# Patient Record
Sex: Female | Born: 1950 | Race: White | Hispanic: No | Marital: Married
Health system: Southern US, Community
[De-identification: ages and names within clinical notes are randomized; demographics above are authoritative.]

## PROBLEM LIST (undated history)

## (undated) HISTORY — PX: BREAST BIOPSY: SHX20

---

## 1997-08-25 ENCOUNTER — Inpatient Hospital Stay (HOSPITAL_COMMUNITY): Admission: RE | Admit: 1997-08-25 | Discharge: 1997-08-27 | Payer: Self-pay | Admitting: Obstetrics & Gynecology

## 1999-09-13 ENCOUNTER — Encounter: Payer: Self-pay | Admitting: Orthopedic Surgery

## 1999-09-13 ENCOUNTER — Ambulatory Visit (HOSPITAL_COMMUNITY): Admission: RE | Admit: 1999-09-13 | Discharge: 1999-09-13 | Payer: Self-pay | Admitting: Orthopedic Surgery

## 2000-02-05 ENCOUNTER — Encounter: Payer: Self-pay | Admitting: Family Medicine

## 2000-02-05 ENCOUNTER — Encounter: Admission: RE | Admit: 2000-02-05 | Discharge: 2000-02-05 | Payer: Self-pay | Admitting: Family Medicine

## 2003-05-02 ENCOUNTER — Encounter: Admission: RE | Admit: 2003-05-02 | Discharge: 2003-05-02 | Payer: Self-pay | Admitting: Family Medicine

## 2005-04-09 ENCOUNTER — Ambulatory Visit: Payer: Self-pay | Admitting: Internal Medicine

## 2005-05-14 ENCOUNTER — Ambulatory Visit: Payer: Self-pay | Admitting: Internal Medicine

## 2010-02-12 ENCOUNTER — Emergency Department (HOSPITAL_COMMUNITY): Admission: EM | Admit: 2010-02-12 | Discharge: 2010-02-12 | Payer: Self-pay | Admitting: Emergency Medicine

## 2012-04-15 IMAGING — CR DG WRIST COMPLETE 3+V*L*
4 series · 4 of 4 positions shown · non-contrast
Comparison: None.

CLINICAL DATA: Fall, diffuse wrist pain.

LEFT WRIST - COMPLETE 3+ VIEW

[x wrist pa left]
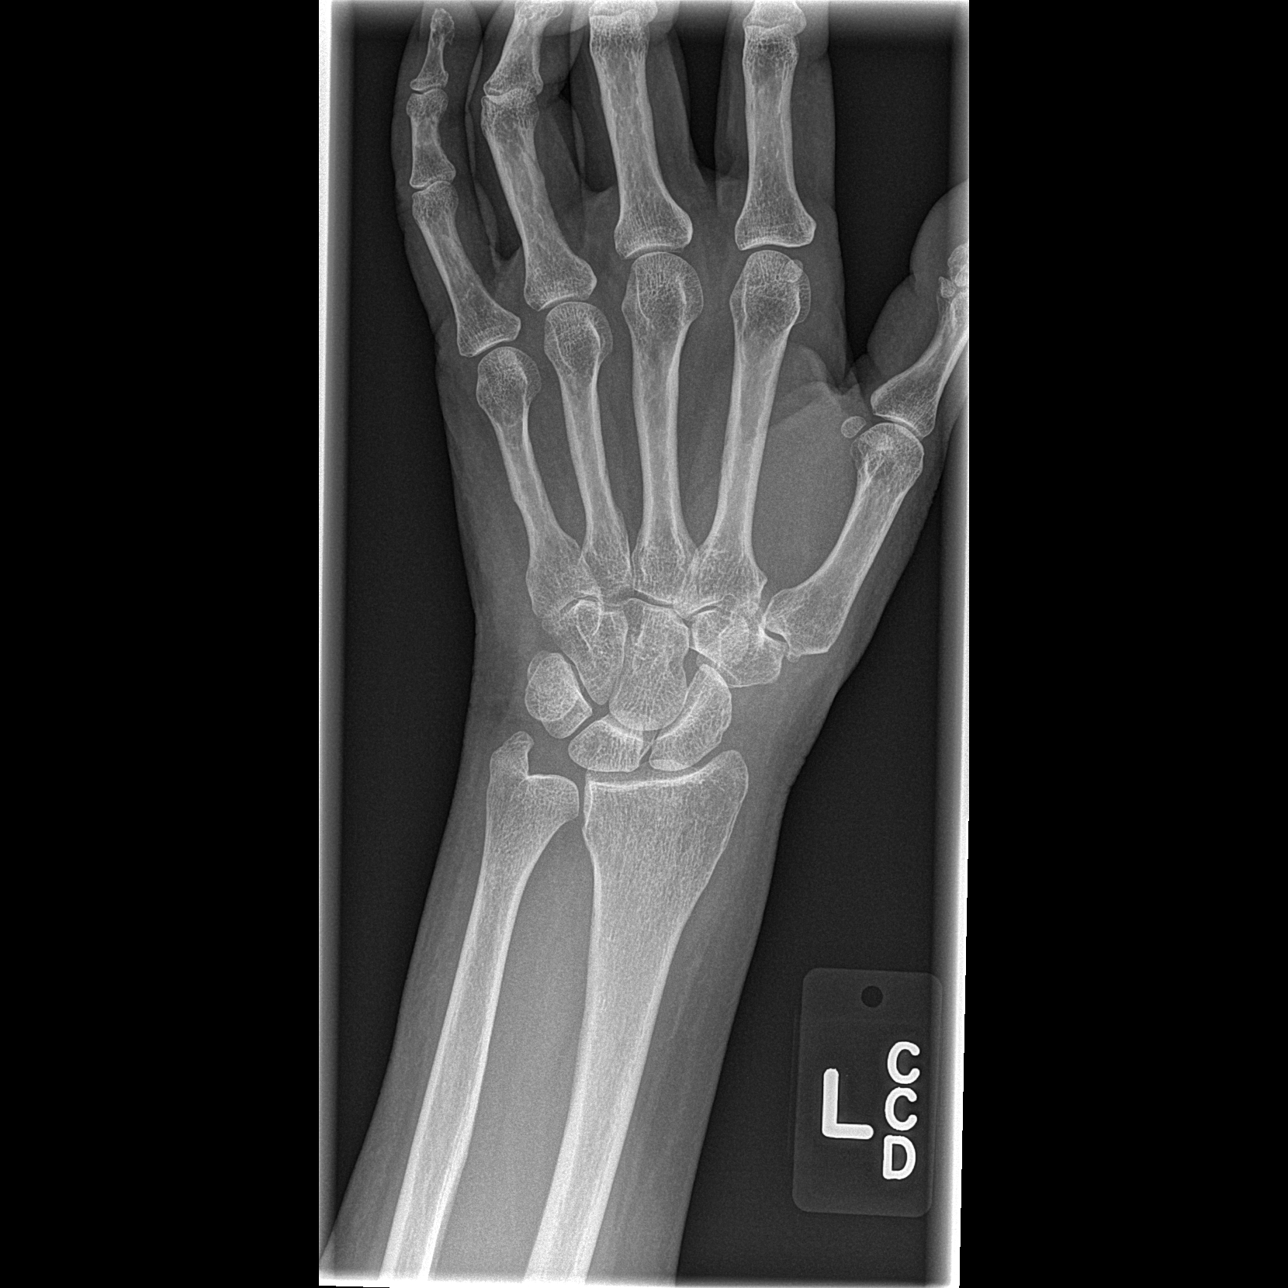

[x wrist obl left]
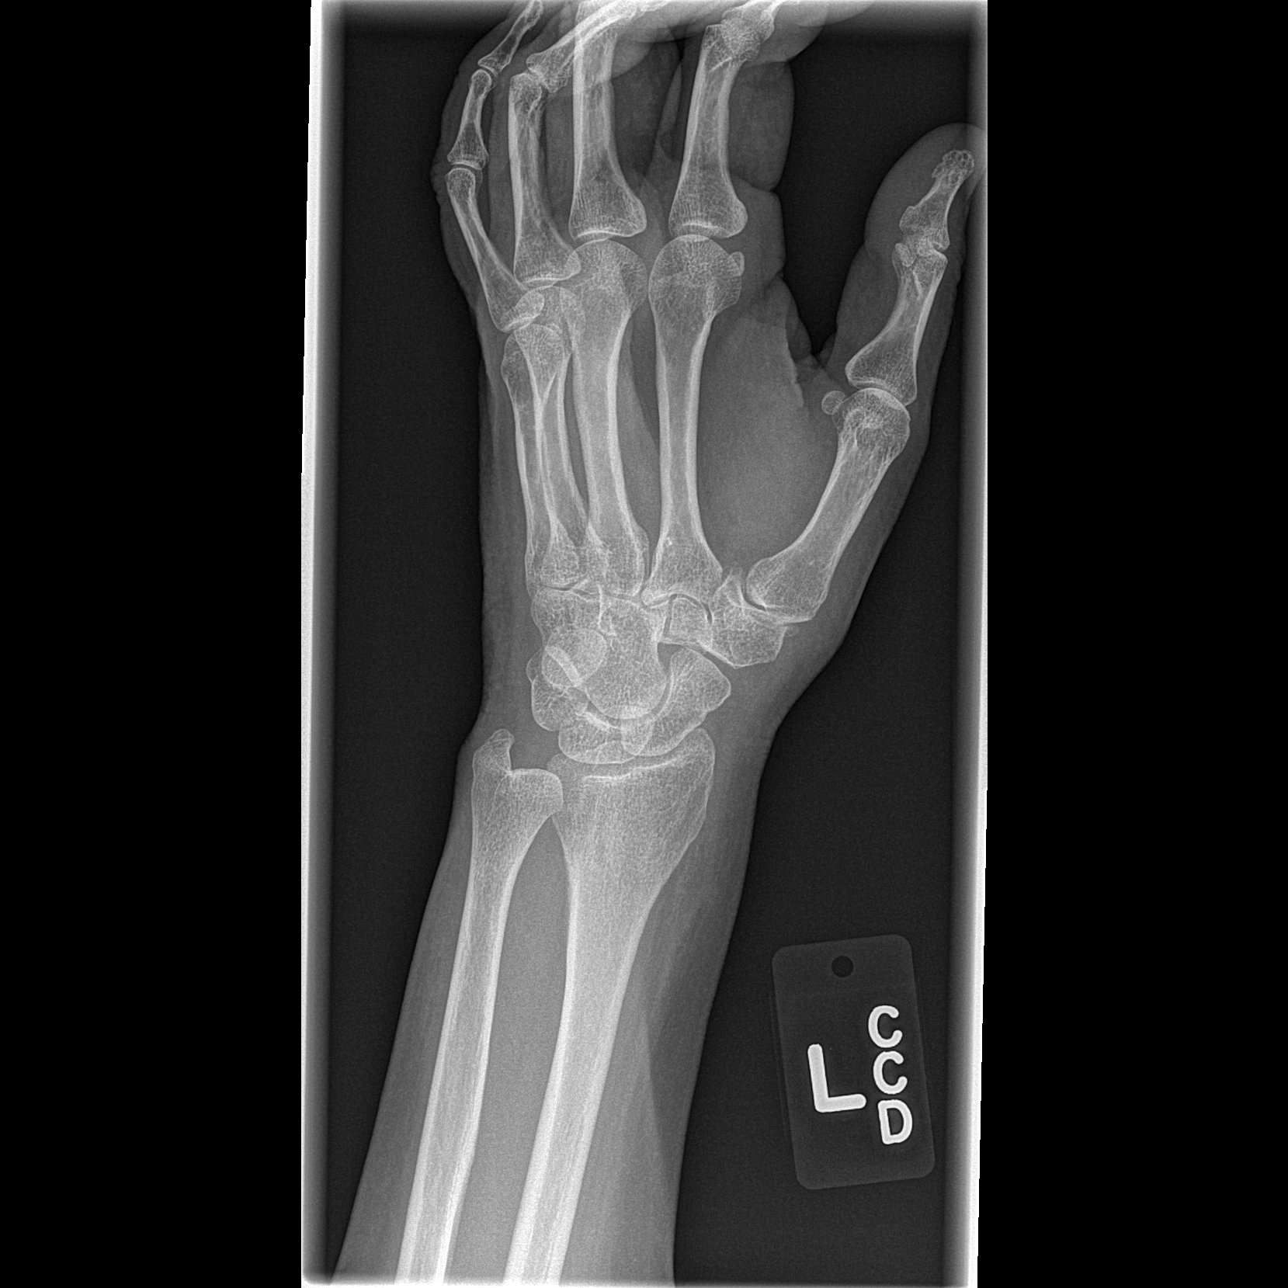

[x wrist lat left]
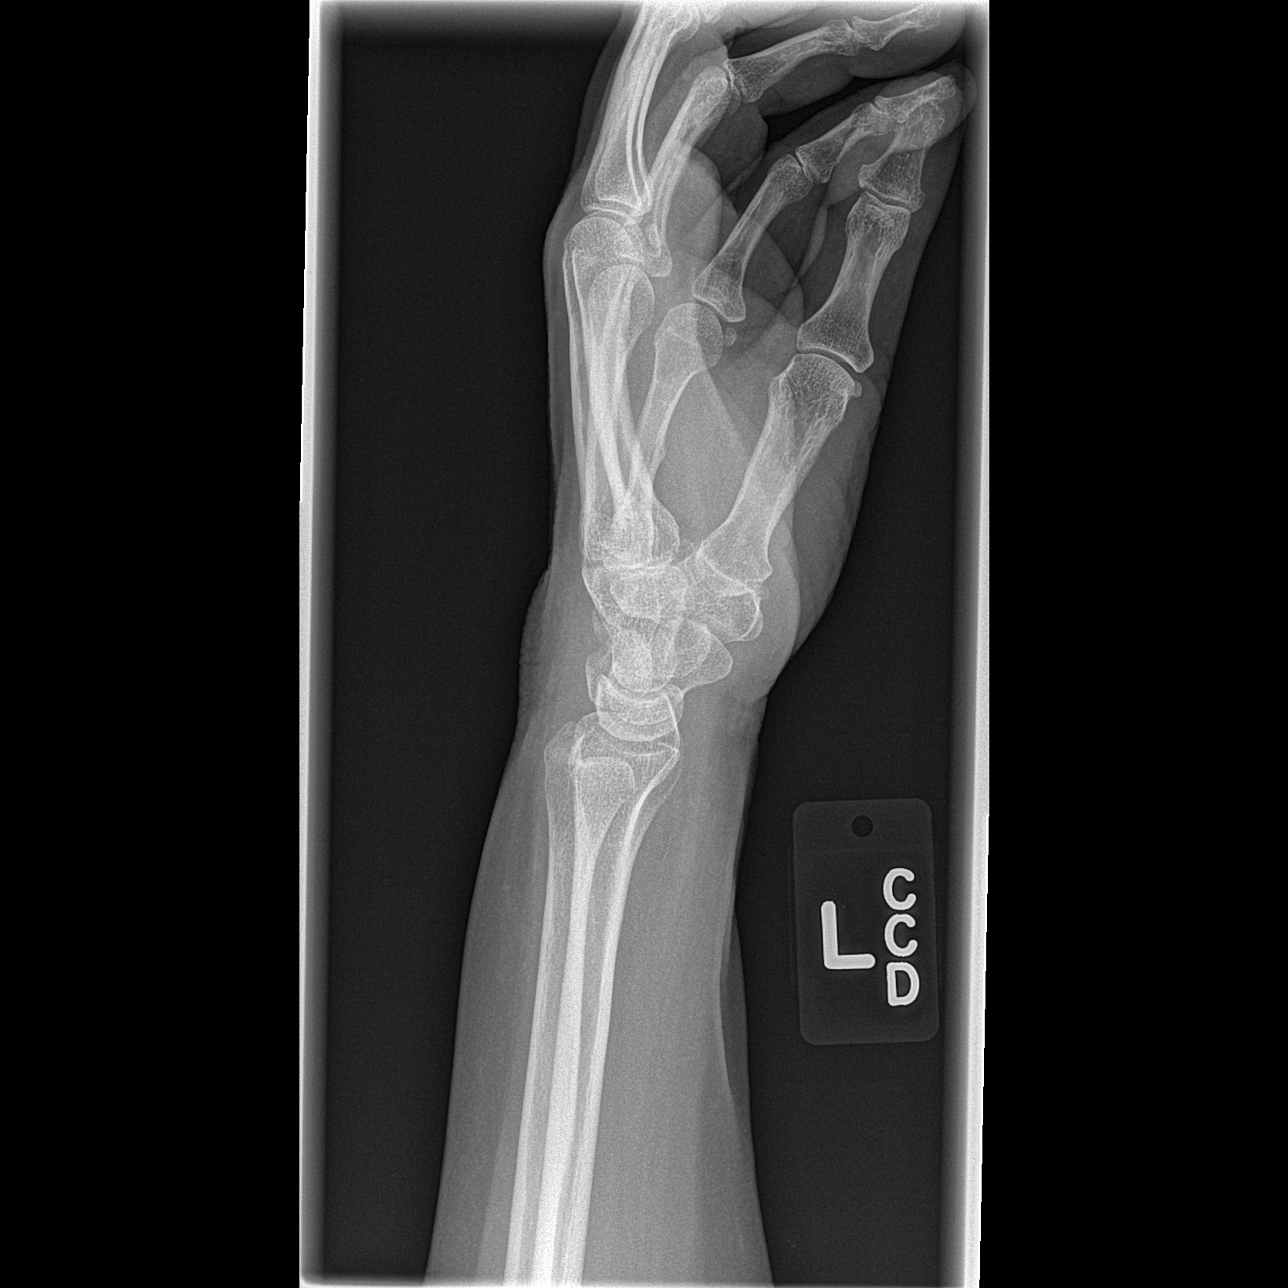

[x wrist navicular]
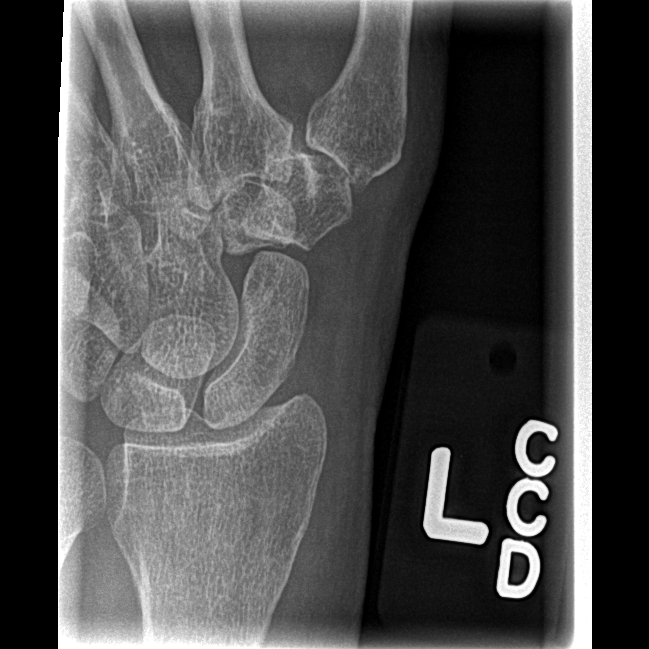

[4 of 4 positions shown; findings below may reference images not displayed]

FINDINGS: The bones are osteopenic.  No fractures are seen at this
time.  Soft tissue swelling is noted at the posterior aspect of the
wrist.
IMPRESSION: Osteopenia and soft tissue swelling.  No fracture is seen at this
time.  If there is persistent pain, repeat radiographs are
recommended in 10-14 days to evaluate for healing fracture.

## 2014-01-07 ENCOUNTER — Other Ambulatory Visit: Payer: Self-pay | Admitting: Family Medicine

## 2014-01-07 DIAGNOSIS — Z1239 Encounter for other screening for malignant neoplasm of breast: Secondary | ICD-10-CM

## 2014-01-07 DIAGNOSIS — E2839 Other primary ovarian failure: Secondary | ICD-10-CM

## 2014-01-26 ENCOUNTER — Other Ambulatory Visit: Payer: Self-pay

## 2014-01-26 ENCOUNTER — Other Ambulatory Visit: Payer: Self-pay | Admitting: Family Medicine

## 2014-01-26 ENCOUNTER — Ambulatory Visit: Payer: Self-pay

## 2014-01-26 DIAGNOSIS — Z1231 Encounter for screening mammogram for malignant neoplasm of breast: Secondary | ICD-10-CM

## 2014-02-23 ENCOUNTER — Ambulatory Visit
Admission: RE | Admit: 2014-02-23 | Discharge: 2014-02-23 | Disposition: A | Discharge status: Home / Self Care | Payer: BC Managed Care – PPO | Source: Ambulatory Visit | Attending: Family Medicine | Admitting: Family Medicine

## 2014-02-23 DIAGNOSIS — E2839 Other primary ovarian failure: Secondary | ICD-10-CM

## 2014-02-23 DIAGNOSIS — Z1231 Encounter for screening mammogram for malignant neoplasm of breast: Secondary | ICD-10-CM

## 2014-02-28 ENCOUNTER — Other Ambulatory Visit: Payer: Self-pay | Admitting: Family Medicine

## 2014-02-28 DIAGNOSIS — R928 Other abnormal and inconclusive findings on diagnostic imaging of breast: Secondary | ICD-10-CM

## 2014-03-30 ENCOUNTER — Ambulatory Visit
Admission: RE | Admit: 2014-03-30 | Discharge: 2014-03-30 | Disposition: A | Discharge status: Home / Self Care | Payer: BLUE CROSS/BLUE SHIELD | Source: Ambulatory Visit | Attending: Family Medicine | Admitting: Family Medicine

## 2014-03-30 ENCOUNTER — Other Ambulatory Visit: Payer: Self-pay | Admitting: Family Medicine

## 2014-03-30 DIAGNOSIS — R928 Other abnormal and inconclusive findings on diagnostic imaging of breast: Secondary | ICD-10-CM

## 2014-04-06 ENCOUNTER — Other Ambulatory Visit: Payer: Self-pay | Admitting: Family Medicine

## 2014-04-06 DIAGNOSIS — R928 Other abnormal and inconclusive findings on diagnostic imaging of breast: Secondary | ICD-10-CM

## 2014-04-11 ENCOUNTER — Ambulatory Visit
Admission: RE | Admit: 2014-04-11 | Discharge: 2014-04-11 | Disposition: A | Discharge status: Home / Self Care | Payer: BLUE CROSS/BLUE SHIELD | Source: Ambulatory Visit | Attending: Family Medicine | Admitting: Family Medicine

## 2014-04-11 ENCOUNTER — Other Ambulatory Visit: Payer: Self-pay | Admitting: Family Medicine

## 2014-04-11 DIAGNOSIS — R928 Other abnormal and inconclusive findings on diagnostic imaging of breast: Secondary | ICD-10-CM

## 2014-04-11 DIAGNOSIS — N6320 Unspecified lump in the left breast, unspecified quadrant: Secondary | ICD-10-CM

## 2017-03-27 ENCOUNTER — Other Ambulatory Visit: Payer: Self-pay | Admitting: Family Medicine

## 2017-03-27 DIAGNOSIS — M810 Age-related osteoporosis without current pathological fracture: Secondary | ICD-10-CM

## 2017-03-27 DIAGNOSIS — Z1231 Encounter for screening mammogram for malignant neoplasm of breast: Secondary | ICD-10-CM

## 2017-04-24 ENCOUNTER — Ambulatory Visit
Admission: RE | Admit: 2017-04-24 | Discharge: 2017-04-24 | Disposition: A | Discharge status: Home / Self Care | Payer: Medicare Other | Source: Ambulatory Visit | Attending: Family Medicine | Admitting: Family Medicine

## 2017-04-24 ENCOUNTER — Other Ambulatory Visit: Payer: Self-pay | Admitting: Family Medicine

## 2017-04-24 DIAGNOSIS — M810 Age-related osteoporosis without current pathological fracture: Secondary | ICD-10-CM

## 2017-04-24 DIAGNOSIS — Z1231 Encounter for screening mammogram for malignant neoplasm of breast: Secondary | ICD-10-CM
